# Patient Record
Sex: Female | Born: 1990 | Race: Black or African American | Hispanic: No | Marital: Single | Smoking: Never smoker
Health system: Southern US, Community
[De-identification: ages and names within clinical notes are randomized; demographics above are authoritative.]

## PROBLEM LIST (undated history)

## (undated) ENCOUNTER — Emergency Department (HOSPITAL_COMMUNITY): Admission: EM | Payer: MEDICAID | Source: Home / Self Care

## (undated) DIAGNOSIS — K219 Gastro-esophageal reflux disease without esophagitis: Secondary | ICD-10-CM

## (undated) DIAGNOSIS — F419 Anxiety disorder, unspecified: Secondary | ICD-10-CM

## (undated) DIAGNOSIS — E669 Obesity, unspecified: Secondary | ICD-10-CM

## (undated) DIAGNOSIS — I10 Essential (primary) hypertension: Secondary | ICD-10-CM

## (undated) DIAGNOSIS — G43909 Migraine, unspecified, not intractable, without status migrainosus: Secondary | ICD-10-CM

## (undated) HISTORY — PX: TONSILLECTOMY: SUR1361

---

## 2010-12-21 ENCOUNTER — Emergency Department (HOSPITAL_COMMUNITY)
Admission: EM | Admit: 2010-12-21 | Discharge: 2010-12-22 | Disposition: A | Discharge status: Home / Self Care | Payer: PRIVATE HEALTH INSURANCE | Attending: Emergency Medicine | Admitting: Emergency Medicine

## 2010-12-21 DIAGNOSIS — R599 Enlarged lymph nodes, unspecified: Secondary | ICD-10-CM | POA: Insufficient documentation

## 2010-12-21 DIAGNOSIS — R350 Frequency of micturition: Secondary | ICD-10-CM | POA: Insufficient documentation

## 2010-12-21 DIAGNOSIS — M545 Low back pain, unspecified: Secondary | ICD-10-CM | POA: Insufficient documentation

## 2010-12-21 DIAGNOSIS — R300 Dysuria: Secondary | ICD-10-CM | POA: Insufficient documentation

## 2010-12-21 DIAGNOSIS — R059 Cough, unspecified: Secondary | ICD-10-CM | POA: Insufficient documentation

## 2010-12-21 DIAGNOSIS — H9209 Otalgia, unspecified ear: Secondary | ICD-10-CM | POA: Insufficient documentation

## 2010-12-21 DIAGNOSIS — IMO0001 Reserved for inherently not codable concepts without codable children: Secondary | ICD-10-CM | POA: Insufficient documentation

## 2010-12-21 DIAGNOSIS — R05 Cough: Secondary | ICD-10-CM | POA: Insufficient documentation

## 2010-12-21 DIAGNOSIS — F411 Generalized anxiety disorder: Secondary | ICD-10-CM | POA: Insufficient documentation

## 2010-12-21 DIAGNOSIS — J3489 Other specified disorders of nose and nasal sinuses: Secondary | ICD-10-CM | POA: Insufficient documentation

## 2010-12-21 DIAGNOSIS — J020 Streptococcal pharyngitis: Secondary | ICD-10-CM | POA: Insufficient documentation

## 2010-12-22 LAB — URINALYSIS, ROUTINE W REFLEX MICROSCOPIC
Ketones, ur: NEGATIVE mg/dL
Leukocytes, UA: NEGATIVE
Nitrite: NEGATIVE
Protein, ur: NEGATIVE mg/dL
Urobilinogen, UA: 1 mg/dL (ref 0.0–1.0)

## 2010-12-22 LAB — RAPID STREP SCREEN (MED CTR MEBANE ONLY): Streptococcus, Group A Screen (Direct): POSITIVE — AB

## 2010-12-22 LAB — POCT PREGNANCY, URINE: Preg Test, Ur: NEGATIVE

## 2010-12-27 ENCOUNTER — Emergency Department (HOSPITAL_COMMUNITY)
Admission: EM | Admit: 2010-12-27 | Discharge: 2010-12-28 | Disposition: A | Discharge status: Home / Self Care | Payer: PRIVATE HEALTH INSURANCE | Attending: Emergency Medicine | Admitting: Emergency Medicine

## 2010-12-27 DIAGNOSIS — IMO0001 Reserved for inherently not codable concepts without codable children: Secondary | ICD-10-CM | POA: Insufficient documentation

## 2010-12-27 DIAGNOSIS — R5383 Other fatigue: Secondary | ICD-10-CM | POA: Insufficient documentation

## 2010-12-27 DIAGNOSIS — J029 Acute pharyngitis, unspecified: Secondary | ICD-10-CM | POA: Insufficient documentation

## 2010-12-27 DIAGNOSIS — R5381 Other malaise: Secondary | ICD-10-CM | POA: Insufficient documentation

## 2011-05-03 ENCOUNTER — Emergency Department (HOSPITAL_COMMUNITY)
Admission: EM | Admit: 2011-05-03 | Discharge: 2011-05-03 | Disposition: A | Discharge status: Home / Self Care | Payer: PRIVATE HEALTH INSURANCE | Attending: Emergency Medicine | Admitting: Emergency Medicine

## 2011-05-03 ENCOUNTER — Encounter (HOSPITAL_COMMUNITY): Payer: Self-pay | Admitting: *Deleted

## 2011-05-03 DIAGNOSIS — K219 Gastro-esophageal reflux disease without esophagitis: Secondary | ICD-10-CM

## 2011-05-03 DIAGNOSIS — R1013 Epigastric pain: Secondary | ICD-10-CM | POA: Insufficient documentation

## 2011-05-03 DIAGNOSIS — E669 Obesity, unspecified: Secondary | ICD-10-CM | POA: Insufficient documentation

## 2011-05-03 HISTORY — DX: Migraine, unspecified, not intractable, without status migrainosus: G43.909

## 2011-05-03 LAB — DIFFERENTIAL
Basophils Absolute: 0.1 10*3/uL (ref 0.0–0.1)
Eosinophils Relative: 1 % (ref 0–5)
Lymphs Abs: 6 10*3/uL — ABNORMAL HIGH (ref 0.7–4.0)
Monocytes Absolute: 0.7 10*3/uL (ref 0.1–1.0)
Neutro Abs: 4.6 10*3/uL (ref 1.7–7.7)

## 2011-05-03 LAB — COMPREHENSIVE METABOLIC PANEL
Albumin: 3.3 g/dL — ABNORMAL LOW (ref 3.5–5.2)
Alkaline Phosphatase: 82 U/L (ref 39–117)
BUN: 10 mg/dL (ref 6–23)
Chloride: 102 mEq/L (ref 96–112)
Creatinine, Ser: 0.66 mg/dL (ref 0.50–1.10)
GFR calc Af Amer: >90 mL/min (ref >90)
Glucose, Bld: 115 mg/dL — ABNORMAL HIGH (ref 70–99)
Potassium: 3.6 mEq/L (ref 3.5–5.1)
Total Bilirubin: 0.3 mg/dL (ref 0.3–1.2)

## 2011-05-03 LAB — CBC
HCT: 31 % — ABNORMAL LOW (ref 36.0–46.0)
MCH: 22.6 pg — ABNORMAL LOW (ref 26.0–34.0)
MCV: 66.2 fL — ABNORMAL LOW (ref 78.0–100.0)
Platelets: 511 10*3/uL — ABNORMAL HIGH (ref 150–400)
RDW: 16.9 % — ABNORMAL HIGH (ref 11.5–15.5)

## 2011-05-03 LAB — PATHOLOGIST SMEAR REVIEW: Tech Review: REACTIVE

## 2011-05-03 LAB — LIPASE, BLOOD: Lipase: 20 U/L (ref 11–59)

## 2011-05-03 MED ORDER — FAMOTIDINE 20 MG PO TABS: 20.0000 mg | ORAL_TABLET | Freq: Two times a day (BID) | ORAL | Status: DC

## 2011-05-03 MED ORDER — GI COCKTAIL ~~LOC~~
30.0000 mL | Freq: Once | ORAL | Status: AC
  Administered 2011-05-03: 30 mL via ORAL
  Filled 2011-05-03: qty 30

## 2011-05-03 NOTE — ED Provider Notes (Signed)
History     CSN: [id number]  Arrival date & time 05/03/11  0025   First MD Initiated Contact with Patient 05/03/11 [phone]      Chief Complaint  Patient presents with  . Abdominal Pain    epigastric    (Consider location/radiation/quality/duration/timing/severity/associated sxs/prior treatment) HPI Comments: Morbidly obese 21 year old female, who reports increased belching over the last 3-4 days with occasional burning that radiates up into her mid chest worse when she lays down and when she sits up.  Does not wake her from sleep, cannot relate this discomfort to food consumption  Patient is a 21 y.o. female presenting with abdominal pain. The history is provided by the patient.  Abdominal Pain The primary symptoms of the illness do not include abdominal pain, fever, nausea, vomiting or diarrhea. The current episode started more than 2 days ago. The onset of the illness was sudden. The problem has not changed since onset. Symptoms associated with the illness do not include chills, heartburn or constipation.    Past Medical History  Diagnosis Date  . Migraines     Past Surgical History  Procedure Date  . Tonsillectomy     Family History  Problem Relation Age of Onset  . Hypertension Mother   . Asthma Mother     History  Substance Use Topics  . Smoking status: Never Smoker   . Smokeless tobacco: Not on file  . Alcohol Use: No    OB History    Grav Para Term Preterm Abortions TAB SAB Ect Mult Living                  Review of Systems  Constitutional: Negative for fever and chills.  HENT: Negative for congestion.   Cardiovascular: Negative for chest pain.  Gastrointestinal: Negative for heartburn, nausea, vomiting, abdominal pain, diarrhea, constipation and abdominal distention.  Neurological: Negative for dizziness and weakness.    Allergies  Review of patient's allergies indicates no known allergies.  Home Medications   Current Outpatient Rx  Name Route  Sig Dispense Refill  . CALCIUM CARBONATE ANTACID 500 MG PO CHEW Oral Chew 1-2 tablets by mouth 3 (three) times daily as needed. Bloating, stomach pressure and pain    . FAMOTIDINE 20 MG PO TABS Oral Take 1 tablet (20 mg total) by mouth 2 (two) times daily. 30 tablet 0    BP 137/88  Pulse 85  Temp(Src) 97.9 F (36.6 C) (Oral)  Resp 16  SpO2 98%  LMP 04/13/2011  Physical Exam  Constitutional: She is oriented to person, place, and time. She appears well-developed and well-nourished.  Eyes: Pupils are equal, round, and reactive to light.  Cardiovascular: Normal rate.   Pulmonary/Chest: Effort normal.  Abdominal: Soft. Bowel sounds are normal. She exhibits no distension. There is tenderness.    Musculoskeletal: Normal range of motion.  Neurological: She is alert and oriented to person, place, and time.    ED Course  Procedures (including critical care time)  Labs Reviewed  CBC - Abnormal; Notable for the following:    WBC 11.5 (*)    Hemoglobin 10.6 (*)    HCT 31.0 (*)    MCV 66.2 (*)    MCH 22.6 (*)    RDW 16.9 (*)    Platelets 511 (*)    All other components within normal limits  DIFFERENTIAL - Abnormal; Notable for the following:    Neutrophils Relative 40 (*)    Lymphocytes Relative 52 (*)    Lymphs Abs 6.0 (*)  All other components within normal limits  COMPREHENSIVE METABOLIC PANEL - Abnormal; Notable for the following:    Glucose, Bld 115 (*)    Albumin 3.3 (*)    All other components within normal limits  LIPASE, BLOOD   No results found.   1. Gastro - esophageal reflux       MDM  This is most likely gastric reflux.  Due to patient's severe obesity.  Will check onset of labs including CBC CMet and lipase, and administer GI cocktail  Patient obtained significant relief after her GI cocktail was sent home with a short course of PPI and followup with GI if needed      Arman Filter, NP 05/03/11 0210  Arman Filter, NP 05/03/11 [phone]  Medical  screening examination/treatment/procedure(s) were performed by non-physician practitioner and as supervising physician I was immediately available for consultation/collaboration.  [name], MD 05/04/11 [phone]

## 2011-05-03 NOTE — ED Notes (Signed)
C/o epigastric abd pain, reports increased belching, chest pressure, a little sob, (denies: cough congestion, cold sx, fever, nvd, dizziness, or sore throat). Has tried tums (saturday/sunday) with no lasting relief.

## 2011-05-03 NOTE — Discharge Instructions (Signed)
Diet for GERD or PUD Nutrition therapy can help ease the discomfort of gastroesophageal reflux disease (GERD) and peptic ulcer disease (PUD).  HOME CARE INSTRUCTIONS   Eat your meals slowly, in a relaxed setting.   Eat 5 to 6 small meals per day.   If a food causes distress, stop eating it for a period of time.  FOODS TO AVOID  Coffee, regular or decaffeinated.   Cola beverages, regular or low calorie.   Tea, regular or decaffeinated.   Pepper.   Cocoa.   High fat foods, including meats.   Butter, margarine, hydrogenated oil (trans fats).   Peppermint or spearmint (if you have GERD).   Fruits and vegetables if not tolerated.   Alcohol.   Nicotine (smoking or chewing). This is one of the most potent stimulants to acid production in the gastrointestinal tract.   Any food that seems to aggravate your condition.  If you have questions regarding your diet, ask your caregiver or a registered dietitian. TIPS  Lying flat may make symptoms worse. Keep the head of your bed raised 6 to 9 inches (15 to 23 cm) by using a foam wedge or blocks under the legs of the bed.   Do not lay down until 3 hours after eating a meal.   Daily physical activity may help reduce symptoms.  MAKE SURE YOU:   Understand these instructions.   Will watch your condition.   Will get help right away if you are not doing well or get worse.  Document Released: 02/20/2005 Document Revised: 11/02/2010 Document Reviewed: 07/06/2008 [facility] Patient Information 2012 [place], [place]. He obtained significant relief with a GI cocktail, making to leave this is gastric reflux.  Please try not to eat 3 hours prior to laying down at night.  I given him a diet that will help with easily digested foods, if you're still having persistent belching pain after he finished the course of Pepcid.  He can buy over-the-counter Pepcid and continue taking it on a daily basis, once a day, and follow up with GI for further  diagnostic evaluation

## 2012-03-17 ENCOUNTER — Emergency Department (HOSPITAL_COMMUNITY)
Admission: EM | Admit: 2012-03-17 | Discharge: 2012-03-17 | Disposition: A | Discharge status: Home / Self Care | Payer: BC Managed Care – PPO | Attending: Emergency Medicine | Admitting: Emergency Medicine

## 2012-03-17 ENCOUNTER — Encounter (HOSPITAL_COMMUNITY): Payer: Self-pay | Admitting: *Deleted

## 2012-03-17 DIAGNOSIS — M25519 Pain in unspecified shoulder: Secondary | ICD-10-CM | POA: Insufficient documentation

## 2012-03-17 DIAGNOSIS — M25569 Pain in unspecified knee: Secondary | ICD-10-CM | POA: Insufficient documentation

## 2012-03-17 DIAGNOSIS — R52 Pain, unspecified: Secondary | ICD-10-CM

## 2012-03-17 DIAGNOSIS — Z79899 Other long term (current) drug therapy: Secondary | ICD-10-CM | POA: Insufficient documentation

## 2012-03-17 DIAGNOSIS — M549 Dorsalgia, unspecified: Secondary | ICD-10-CM | POA: Insufficient documentation

## 2012-03-17 DIAGNOSIS — Z791 Long term (current) use of non-steroidal anti-inflammatories (NSAID): Secondary | ICD-10-CM | POA: Insufficient documentation

## 2012-03-17 DIAGNOSIS — E669 Obesity, unspecified: Secondary | ICD-10-CM

## 2012-03-17 DIAGNOSIS — Z8679 Personal history of other diseases of the circulatory system: Secondary | ICD-10-CM | POA: Insufficient documentation

## 2012-03-17 HISTORY — DX: Obesity, unspecified: E66.9

## 2012-03-17 NOTE — ED Notes (Signed)
Pt reports having generalized pain that moves to diff locations. Has pain to legs, arms, head and swelling to right underarm. No distress noted at triage.

## 2012-03-17 NOTE — ED Notes (Signed)
Pt requested for temp to be taken.

## 2012-03-17 NOTE — ED Notes (Signed)
Awaiting discharge paperwork.  EDP still working on discharge.

## 2012-03-17 NOTE — ED Notes (Signed)
Pt states she was seen a month ago for pulsating pains down her spine and was told that she was having palpitations.  States she requested EKG and was told that it was normal.  Pt states she now has pain on different parts of body but is generally on the right side.  Denies nausea, vomiting, and urinary problems.  Pt reports both breast are sore at times and that her period is 1 month late.  Reports negative home pregnancy test a couple weeks ago.  Pt states she is sexually active.

## 2012-03-17 NOTE — ED Provider Notes (Signed)
History    22 year old female with multiple complaints. Patient has been having intermittent pain in multiple locations including her neck, bilateral shoulders, upper back, and knees. Denies any acute trauma. The pain is achy in character and lasts anywhere from 15 minutes to a few hours. Some relief with ibuprofen. No fevers or chills. No rash. Since yesterday patient has noticed a small hard lump in her right axilla. Tender to the touch. No drainage. No redness. No fevers or chills. No diagnosed history of diabetes.  CSN: [id number]  Arrival date & time 03/17/12  1436   First MD Initiated Contact with Patient 03/17/12 1612      Chief Complaint  Patient presents with  . Pain    (Consider location/radiation/quality/duration/timing/severity/associated sxs/prior treatment) HPI  Past Medical History  Diagnosis Date  . Migraines   . Obesity     Past Surgical History  Procedure Date  . Tonsillectomy     Family History  Problem Relation Age of Onset  . Hypertension Mother   . Asthma Mother     History  Substance Use Topics  . Smoking status: Never Smoker   . Smokeless tobacco: Not on file  . Alcohol Use: No    OB History    Grav Para Term Preterm Abortions TAB SAB Ect Mult Living                  Review of Systems  All systems reviewed and negative, other than as noted in HPI.   Allergies  Review of patient's allergies indicates no known allergies.  Home Medications   Current Outpatient Rx  Name  Route  Sig  Dispense  Refill  . ACETAMINOPHEN 325 MG PO TABS   Oral   Take 650 mg by mouth every 6 (six) hours as needed. For pain         . FAMOTIDINE 20 MG PO TABS   Oral   Take 20 mg by mouth 2 (two) times daily as needed. For acid reflux         . IBUPROFEN 200 MG PO TABS   Oral   Take 400 mg by mouth every 6 (six) hours as needed. For pain           BP 145/91  Pulse 104  Temp 98.1 F (36.7 C) (Oral)  Resp 18  SpO2 99%  LMP  02/03/2012  Physical Exam  Nursing note and vitals reviewed. Constitutional: She appears well-developed and well-nourished. No distress.       Laying in bed. No acute distress. Morbidly obese.  HENT:  Head: Normocephalic and atraumatic.  Eyes: Conjunctivae normal are normal. Right eye exhibits no discharge. Left eye exhibits no discharge.  Neck: Neck supple.  Cardiovascular: Normal rate, regular rhythm and normal heart sounds.  Exam reveals no gallop and no friction rub.   No murmur heard. Pulmonary/Chest: Effort normal and breath sounds normal. No respiratory distress.  Abdominal: Soft. She exhibits no distension. There is no tenderness.  Musculoskeletal: She exhibits no edema and no tenderness.       Full range of motion of all her joints without significant increase in pain. No concerning skin changes.  Neurological: She is alert.  Skin: Skin is warm and dry.       Patient with some mild tenderness with palpation in her right axilla. No nodes felt. No evidence of abscess or other evidence of infection.   Psychiatric: She has a normal mood and affect. Her behavior is normal. Thought content  normal.    ED Course  Procedures (including critical care time)  Labs Reviewed - No data to display No results found.   1. Pain   2. Obesity       MDM  22 year old female with atraumatic migratory pain. Suspect a lot of patient's complaints are related to her morbid obesity. Discussed lifestyle modification, diet and the importance of exercise. Do not feel that imaging or further workup is indicated at this time. NSAIDs as needed for her aches and pains. Also discussed the importance of having a primary care provider. Resource list was provided.        Raeford Razor, MD 03/17/12 [phone]

## 2012-03-31 ENCOUNTER — Encounter (HOSPITAL_COMMUNITY): Payer: Self-pay | Admitting: Physical Medicine and Rehabilitation

## 2012-03-31 ENCOUNTER — Emergency Department (HOSPITAL_COMMUNITY): Payer: BC Managed Care – PPO

## 2012-03-31 ENCOUNTER — Emergency Department (HOSPITAL_COMMUNITY)
Admission: EM | Admit: 2012-03-31 | Discharge: 2012-03-31 | Disposition: A | Discharge status: Home / Self Care | Payer: BC Managed Care – PPO | Attending: Emergency Medicine | Admitting: Emergency Medicine

## 2012-03-31 DIAGNOSIS — Z3202 Encounter for pregnancy test, result negative: Secondary | ICD-10-CM | POA: Insufficient documentation

## 2012-03-31 DIAGNOSIS — F411 Generalized anxiety disorder: Secondary | ICD-10-CM | POA: Insufficient documentation

## 2012-03-31 DIAGNOSIS — R002 Palpitations: Secondary | ICD-10-CM | POA: Insufficient documentation

## 2012-03-31 DIAGNOSIS — R0789 Other chest pain: Secondary | ICD-10-CM | POA: Insufficient documentation

## 2012-03-31 DIAGNOSIS — Z79899 Other long term (current) drug therapy: Secondary | ICD-10-CM | POA: Insufficient documentation

## 2012-03-31 DIAGNOSIS — F419 Anxiety disorder, unspecified: Secondary | ICD-10-CM

## 2012-03-31 DIAGNOSIS — Z8679 Personal history of other diseases of the circulatory system: Secondary | ICD-10-CM | POA: Insufficient documentation

## 2012-03-31 LAB — BASIC METABOLIC PANEL
Calcium: 9.2 mg/dL (ref 8.4–10.5)
GFR calc Af Amer: >90 mL/min (ref >90)
GFR calc non Af Amer: >90 mL/min (ref >90)
Potassium: 3.7 mEq/L (ref 3.5–5.1)
Sodium: 136 mEq/L (ref 135–145)

## 2012-03-31 LAB — CBC
Hemoglobin: 10.4 g/dL — ABNORMAL LOW (ref 12.0–15.0)
MCH: 20.8 pg — ABNORMAL LOW (ref 26.0–34.0)
MCHC: 33.7 g/dL (ref 30.0–36.0)
Platelets: 556 10*3/uL — ABNORMAL HIGH (ref 150–400)
RDW: 17.8 % — ABNORMAL HIGH (ref 11.5–15.5)

## 2012-03-31 LAB — PREGNANCY, URINE: Preg Test, Ur: NEGATIVE

## 2012-03-31 LAB — D-DIMER, QUANTITATIVE: D-Dimer, Quant: <0.27 ug/mL-FEU (ref 0.00–0.48)

## 2012-03-31 MED ORDER — LORAZEPAM 2 MG/ML IJ SOLN
1.0000 mg | Freq: Once | INTRAMUSCULAR | Status: AC
  Administered 2012-03-31: 1 mg via INTRAVENOUS
  Filled 2012-03-31: qty 1

## 2012-03-31 NOTE — ED Provider Notes (Signed)
History     CSN: [id number]  Arrival date & time 03/31/12  4098   First MD Initiated Contact with Patient 03/31/12 0805      Chief Complaint  Patient presents with  . Palpitations    (Consider location/radiation/quality/duration/timing/severity/associated sxs/prior treatment) Patient is a 22 y.o. female presenting with palpitations. The history is provided by the patient. No language interpreter was used.  Palpitations  This is a new problem. The current episode started 1 to 2 hours ago. Episode frequency: intermittently. The problem has been gradually improving. The problem is associated with anxiety. On average, each episode lasts 5 minutes. Associated symptoms include chest pressure (very mild). Pertinent negatives include no diaphoresis, no fever, no malaise/fatigue, no numbness, no chest pain, no claudication, no exertional chest pressure, no irregular heartbeat, no near-syncope, no orthopnea, no syncope, no abdominal pain, no nausea, no vomiting, no headaches, no back pain, no leg pain, no lower extremity edema, no dizziness, no weakness, no cough and no shortness of breath. She has tried nothing for the symptoms. Risk factors include obesity and a sedentary lifestyle.    Past Medical History  Diagnosis Date  . Migraines   . Obesity     Past Surgical History  Procedure Date  . Tonsillectomy     Family History  Problem Relation Age of Onset  . Hypertension Mother   . Asthma Mother     History  Substance Use Topics  . Smoking status: Never Smoker   . Smokeless tobacco: Not on file  . Alcohol Use: No    OB History    Grav Para Term Preterm Abortions TAB SAB Ect Mult Living                  Review of Systems  Constitutional: Negative for fever, chills, malaise/fatigue, diaphoresis and fatigue.  HENT: Negative for congestion, sore throat, rhinorrhea and neck pain.   Eyes: Negative for visual disturbance.  Respiratory: Positive for chest tightness. Negative for  cough, choking and shortness of breath.   Cardiovascular: Positive for palpitations. Negative for chest pain, orthopnea, claudication, leg swelling, syncope and near-syncope.  Gastrointestinal: Negative for nausea, vomiting, abdominal pain and diarrhea.  Genitourinary: Negative for dysuria.  Musculoskeletal: Negative for myalgias, back pain and arthralgias.  Skin: Negative for rash and wound.  Neurological: Negative for dizziness, syncope, weakness, light-headedness, numbness and headaches.  Psychiatric/Behavioral: Negative for hallucinations and dysphoric mood. The patient is nervous/anxious.     Allergies  Review of patient's allergies indicates no known allergies.  Home Medications   Current Outpatient Rx  Name  Route  Sig  Dispense  Refill  . ACETAMINOPHEN 325 MG PO TABS   Oral   Take 650 mg by mouth every 6 (six) hours as needed. For pain         . FAMOTIDINE 20 MG PO TABS   Oral   Take 20 mg by mouth 2 (two) times daily as needed. For acid reflux         . IBUPROFEN 200 MG PO TABS   Oral   Take 400 mg by mouth every 6 (six) hours as needed. For pain         . NORGESTIMATE-ETH ESTRADIOL 0.25-35 MG-MCG PO TABS   Oral   Take 1 tablet by mouth daily.         . SERTRALINE HCL 50 MG PO TABS   Oral   Take 50 mg by mouth daily.  BP 149/83  Pulse 87  Temp 97.8 F (36.6 C) (Oral)  Resp 18  SpO2 100%  LMP 02/03/2012  Physical Exam  Nursing note and vitals reviewed. Constitutional: She is oriented to person, place, and time. She appears well-nourished. No distress.       Pt is morbidly obese   HENT:  Head: Normocephalic and atraumatic.  Right Ear: External ear normal.  Left Ear: External ear normal.  Nose: Nose normal.  Mouth/Throat: Oropharynx is clear and moist. No oropharyngeal exudate.  Eyes: Conjunctivae normal are normal. Pupils are equal, round, and reactive to light. Right eye exhibits no discharge. Left eye exhibits no discharge. No  scleral icterus.  Neck: Normal range of motion. Neck supple. No JVD present. No tracheal deviation present.  Cardiovascular: Normal rate, regular rhythm, normal heart sounds and intact distal pulses.  Exam reveals no gallop and no friction rub.   No murmur heard. Pulmonary/Chest: Effort normal and breath sounds normal. No stridor. No respiratory distress. She has no wheezes. She has no rales. She exhibits no tenderness.  Abdominal: Soft. Bowel sounds are normal. She exhibits no distension and no mass. There is no tenderness. There is no rebound and no guarding.       Obese, protuberant.   Genitourinary: No breast swelling, tenderness, discharge or bleeding.       Large, pendulous breast.  Note no skin changes.  Right breast has no specific tenderness.  There are no skin changes or erythema along the skin folds.  Musculoskeletal: Normal range of motion. She exhibits no edema and no tenderness.       No calf tenderness   Lymphadenopathy:    She has no cervical adenopathy.  Neurological: She is alert and oriented to person, place, and time.  Skin: Skin is warm and dry. No rash noted. She is not diaphoretic. No erythema. No pallor.       Note no swelling under right arm.  Skin appears normal.  No erythema.  No nodules or palpable fluctuance.  Psychiatric: She has a normal mood and affect. Her behavior is normal.    ED Course  Procedures (including critical care time)   Labs Reviewed  CBC  BASIC METABOLIC PANEL  D-DIMER, QUANTITATIVE  PREGNANCY, URINE   No results found.   No diagnosis found.   Date: 03/31/2012 @ 0800  Rate: 93 bpm  Rhythm: sinus  QRS Axis: normal  Intervals: normal  ST/T Wave abnormalities: normal  Conduction Disutrbances:none  Narrative Interpretation:   Old EKG Reviewed: none available      MDM  Pt presents for evaluation of palpitations.  She also reports swelling in her right axilla and vague right breast discomfort.  She appears nontoxic, note  stable VS, NAD.  Note no skin changes, masses, or fluctuance on inspection of the right axilla.  She has no swelling of the breast or specific breast tenderness.  The EKG performed during the triage process demonstrates no dysrhythmia or evidence of acute ischemia.  She has no calf tenderness.  She is morbidly obese, admits to a sedentary lifestyle, and is taking an oral contraceptive medication.  Will obtain basic labs,  CXR, and d-dimer.  She has a hx of anxiety but states she has not experienced a panic attack in over 1 year.  1020.  Pt stable, NAD.  She appears comfortable.  Although she has some anemia, this is not an acute issue.  Labs are otherwise unremarkable.  The d-dimer is negative.  She has had  no tachycardia or witnessed dysrhythmia since arriving in the ER.  Plan discharge home.  Encouraged outpt follow-up with a primary physician and gyn provider.  If palpitations persists, she has been given information to follow-up with a local cardiologist for placement of a holter/event monitor.        Tobin Chad, MD 03/31/12 1023

## 2012-03-31 NOTE — ED Notes (Signed)
Pt presents to department for evaluation of heart palpitations. States onset this morning while at home resting. States she could feel her heart "racing." denies chest pain at present. Respirations unlabored. Skin warm and dry. She is alert and oriented x4. Also c/o pain to R breast and axillary region.

## 2012-05-21 ENCOUNTER — Emergency Department (HOSPITAL_COMMUNITY)
Admission: EM | Admit: 2012-05-21 | Discharge: 2012-05-21 | Disposition: A | Discharge status: Home / Self Care | Payer: BC Managed Care – PPO | Attending: Emergency Medicine | Admitting: Emergency Medicine

## 2012-05-21 ENCOUNTER — Emergency Department (HOSPITAL_COMMUNITY): Payer: BC Managed Care – PPO

## 2012-05-21 ENCOUNTER — Encounter (HOSPITAL_COMMUNITY): Payer: Self-pay | Admitting: *Deleted

## 2012-05-21 DIAGNOSIS — R0602 Shortness of breath: Secondary | ICD-10-CM | POA: Insufficient documentation

## 2012-05-21 DIAGNOSIS — Z8679 Personal history of other diseases of the circulatory system: Secondary | ICD-10-CM | POA: Insufficient documentation

## 2012-05-21 DIAGNOSIS — F411 Generalized anxiety disorder: Secondary | ICD-10-CM | POA: Insufficient documentation

## 2012-05-21 DIAGNOSIS — E669 Obesity, unspecified: Secondary | ICD-10-CM | POA: Insufficient documentation

## 2012-05-21 DIAGNOSIS — K219 Gastro-esophageal reflux disease without esophagitis: Secondary | ICD-10-CM

## 2012-05-21 DIAGNOSIS — M549 Dorsalgia, unspecified: Secondary | ICD-10-CM | POA: Insufficient documentation

## 2012-05-21 DIAGNOSIS — Z79899 Other long term (current) drug therapy: Secondary | ICD-10-CM | POA: Insufficient documentation

## 2012-05-21 DIAGNOSIS — I10 Essential (primary) hypertension: Secondary | ICD-10-CM | POA: Insufficient documentation

## 2012-05-21 DIAGNOSIS — R209 Unspecified disturbances of skin sensation: Secondary | ICD-10-CM | POA: Insufficient documentation

## 2012-05-21 DIAGNOSIS — R0789 Other chest pain: Secondary | ICD-10-CM | POA: Insufficient documentation

## 2012-05-21 HISTORY — DX: Gastro-esophageal reflux disease without esophagitis: K21.9

## 2012-05-21 HISTORY — DX: Anxiety disorder, unspecified: F41.9

## 2012-05-21 HISTORY — DX: Essential (primary) hypertension: I10

## 2012-05-21 LAB — URINALYSIS, ROUTINE W REFLEX MICROSCOPIC
Glucose, UA: NEGATIVE mg/dL
Hgb urine dipstick: NEGATIVE
Leukocytes, UA: NEGATIVE
Protein, ur: NEGATIVE mg/dL
pH: 7 (ref 5.0–8.0)

## 2012-05-21 MED ORDER — SUCRALFATE 1 G PO TABS
1.0000 g | ORAL_TABLET | Freq: Once | ORAL | Status: AC
  Administered 2012-05-21: 1 g via ORAL
  Filled 2012-05-21: qty 1

## 2012-05-21 MED ORDER — SUCRALFATE 1 G PO TABS: 1.0000 g | ORAL_TABLET | Freq: Four times a day (QID) | ORAL | Status: DC

## 2012-05-21 MED ORDER — LORAZEPAM 1 MG PO TABS
1.0000 mg | ORAL_TABLET | Freq: Once | ORAL | Status: AC
  Administered 2012-05-21: 1 mg via ORAL
  Filled 2012-05-21: qty 1

## 2012-05-21 MED ORDER — LANSOPRAZOLE 30 MG PO CPDR: 30.0000 mg | DELAYED_RELEASE_CAPSULE | Freq: Every day | ORAL | Status: AC

## 2012-05-21 MED ORDER — GI COCKTAIL ~~LOC~~
30.0000 mL | Freq: Once | ORAL | Status: AC
  Administered 2012-05-21: 30 mL via ORAL
  Filled 2012-05-21: qty 30

## 2012-05-21 NOTE — ED Notes (Signed)
Pt states that on Sunday she was having pressure in her chest. Pt states that she thought it was acid reflux. Pt states that she pepcid and that did not help with the pain. Pt states that her pain came back today radiated down her right arm and in her upper back causing SOB

## 2012-05-21 NOTE — ED Provider Notes (Signed)
History     CSN: [id number]  Arrival date & time 05/21/12  0246   First MD Initiated Contact with Patient 05/21/12 [phone]      Chief Complaint  Patient presents with  . Shortness of Breath  . Chest Pain    (Consider location/radiation/quality/duration/timing/severity/associated sxs/prior treatment) HPI 22 year old female presents to the emergency department with complaint of chest pressure with radiation into her right breast, numbness of her right arm.  And pain in her upper back, radiating down into her lower back.  She first noted some symptoms on Sunday.  They have been persistent throughout the entire day today.  Patient thought symptoms may be due to GERD, as they are similar to, what she's had before.  She took a Pepcid without improvement in her symptoms.  Patient is nonsmoker, no family history of coronary disease.  Patient has history of reflux, hypertension, and anxiety.  Patient had been on birth control pills, but has been off for at least a month.  She denies any leg swelling.  No shortness of breath.  Past Medical History  Diagnosis Date  . Migraines   . Obesity   . Hypertension   . Anxiety   . Acid reflux     Past Surgical History  Procedure Laterality Date  . Tonsillectomy      Family History  Problem Relation Age of Onset  . Hypertension Mother   . Asthma Mother     History  Substance Use Topics  . Smoking status: Never Smoker   . Smokeless tobacco: Not on file  . Alcohol Use: No    OB History   Grav Para Term Preterm Abortions TAB SAB Ect Mult Living                  Review of Systems  All other systems reviewed and are negative.     Allergies her  Review of patient's allergies indicates no known allergies.  Home Medications   Current Outpatient Rx  Name  Route  Sig  Dispense  Refill  . famotidine (PEPCID) 20 MG tablet   Oral   Take 20 mg by mouth 2 (two) times daily as needed. For acid reflux         . FLUoxetine (PROZAC) 10 MG  capsule   Oral   Take 10 mg by mouth daily.         . hydrochlorothiazide (HYDRODIURIL) 25 MG tablet   Oral   Take 25 mg by mouth daily.         Marland Kitchen lisinopril (PRINIVIL,ZESTRIL) 10 MG tablet   Oral   Take 10 mg by mouth daily.           BP 153/86  Pulse 87  Temp(Src) 98 F (36.7 C) (Oral)  Resp 14  SpO2 99%  LMP 05/03/2012  Physical Exam  Constitutional: She is oriented to person, place, and time. She appears distressed (uncomfortable appearing).  Morbid obesity noted  HENT:  Head: Normocephalic and atraumatic.  Nose: Nose normal.  Mouth/Throat: Oropharynx is clear and moist.  Eyes: Conjunctivae and EOM are normal. Pupils are equal, round, and reactive to light.  Neck: Normal range of motion. Neck supple. No JVD present. No tracheal deviation present. No thyromegaly present.  Cardiovascular: Normal rate, regular rhythm, normal heart sounds and intact distal pulses.  Exam reveals no gallop and no friction rub.   No murmur heard. Pulmonary/Chest: Effort normal and breath sounds normal. No stridor. No respiratory distress. She has no wheezes.  She has no rales. She exhibits no tenderness.  Abdominal: Soft. Bowel sounds are normal. She exhibits no distension and no mass. There is tenderness (mild epigastric tenderness). There is no rebound and no guarding.  Musculoskeletal: Normal range of motion. She exhibits no edema and no tenderness.  Lymphadenopathy:    She has no cervical adenopathy.  Neurological: She is alert and oriented to person, place, and time. She exhibits normal muscle tone. Coordination normal.  Skin: Skin is warm and dry. No rash noted. She is not diaphoretic. No erythema. No pallor.  Psychiatric: She has a normal mood and affect. Her behavior is normal. Judgment and thought content normal.    ED Course  Procedures (including critical care time)  Labs Reviewed  URINALYSIS, ROUTINE W REFLEX MICROSCOPIC   Dg Chest 2 View  05/21/2012  *RADIOLOGY REPORT*   Clinical Data: Mid and upper chest pain.  CHEST - 2 VIEW  Comparison: 03/31/2012  Findings: Shallow inspiration.  Borderline heart size and pulmonary vascularity.  No focal consolidation or airspace disease.  No blunting of costophrenic angles.  No pneumothorax.  Mediastinal contours appear intact.  No significant change since previous study.  IMPRESSION: Shallow inspiration.  No evidence of active pulmonary disease.   Original Report Authenticated By: Burman Nieves, M.D.     Date: 05/21/2012  Rate: 88  Rhythm: normal sinus rhythm  QRS Axis: normal  Intervals: normal  ST/T Wave abnormalities: normal  Conduction Disutrbances:none  Narrative Interpretation:   Old EKG Reviewed: unchanged    1. Atypical chest pain   2. GERD (gastroesophageal reflux disease)       MDM   22 year old female with chest discomfort.  Will check chest x-ray.  Do not feel symptoms are due to ACS, PE, pneumonia, bronchitis.  Will attempt to relieve symptoms with reflux medications, and small dose of Ativan given her history of anxiety.        Olivia Mackie, MD 05/21/12 [phone]

## 2012-05-21 NOTE — ED Notes (Signed)
Pt to xray, no changes, alert, NAD, calm. 

## 2012-05-21 NOTE — ED Notes (Signed)
Dr. Otter EDP at BS 

## 2012-06-16 ENCOUNTER — Encounter (HOSPITAL_COMMUNITY): Payer: Self-pay | Admitting: *Deleted

## 2012-06-16 ENCOUNTER — Emergency Department (HOSPITAL_COMMUNITY)
Admission: EM | Admit: 2012-06-16 | Discharge: 2012-06-16 | Disposition: A | Discharge status: Home / Self Care | Payer: BC Managed Care – PPO | Attending: Emergency Medicine | Admitting: Emergency Medicine

## 2012-06-16 DIAGNOSIS — K219 Gastro-esophageal reflux disease without esophagitis: Secondary | ICD-10-CM | POA: Insufficient documentation

## 2012-06-16 DIAGNOSIS — R350 Frequency of micturition: Secondary | ICD-10-CM | POA: Insufficient documentation

## 2012-06-16 DIAGNOSIS — M79604 Pain in right leg: Secondary | ICD-10-CM

## 2012-06-16 DIAGNOSIS — M79609 Pain in unspecified limb: Secondary | ICD-10-CM | POA: Insufficient documentation

## 2012-06-16 DIAGNOSIS — Z79899 Other long term (current) drug therapy: Secondary | ICD-10-CM | POA: Insufficient documentation

## 2012-06-16 DIAGNOSIS — R000 Tachycardia, unspecified: Secondary | ICD-10-CM | POA: Insufficient documentation

## 2012-06-16 DIAGNOSIS — E669 Obesity, unspecified: Secondary | ICD-10-CM | POA: Insufficient documentation

## 2012-06-16 DIAGNOSIS — Z3202 Encounter for pregnancy test, result negative: Secondary | ICD-10-CM | POA: Insufficient documentation

## 2012-06-16 DIAGNOSIS — F411 Generalized anxiety disorder: Secondary | ICD-10-CM | POA: Insufficient documentation

## 2012-06-16 DIAGNOSIS — G43909 Migraine, unspecified, not intractable, without status migrainosus: Secondary | ICD-10-CM | POA: Insufficient documentation

## 2012-06-16 DIAGNOSIS — I10 Essential (primary) hypertension: Secondary | ICD-10-CM | POA: Insufficient documentation

## 2012-06-16 LAB — BASIC METABOLIC PANEL
CO2: 26 mEq/L (ref 19–32)
Chloride: 100 mEq/L (ref 96–112)
Glucose, Bld: 112 mg/dL — ABNORMAL HIGH (ref 70–99)
Sodium: 136 mEq/L (ref 135–145)

## 2012-06-16 LAB — CBC
HCT: 30.9 % — ABNORMAL LOW (ref 36.0–46.0)
Hemoglobin: 10.8 g/dL — ABNORMAL LOW (ref 12.0–15.0)
MCH: 21.3 pg — ABNORMAL LOW (ref 26.0–34.0)
MCV: 60.8 fL — ABNORMAL LOW (ref 78.0–100.0)
RBC: 5.08 MIL/uL (ref 3.87–5.11)
WBC: 11.5 10*3/uL — ABNORMAL HIGH (ref 4.0–10.5)

## 2012-06-16 LAB — POCT I-STAT TROPONIN I

## 2012-06-16 MED ORDER — IBUPROFEN 600 MG PO TABS: 600.0000 mg | ORAL_TABLET | Freq: Four times a day (QID) | ORAL | Status: DC | PRN

## 2012-06-16 MED ORDER — KETOROLAC TROMETHAMINE 30 MG/ML IJ SOLN
30.0000 mg | Freq: Once | INTRAMUSCULAR | Status: AC
  Administered 2012-06-16: 30 mg via INTRAVENOUS
  Filled 2012-06-16: qty 1

## 2012-06-16 MED ORDER — SODIUM CHLORIDE 0.9 % IV BOLUS (SEPSIS)
1000.0000 mL | Freq: Once | INTRAVENOUS | Status: AC
  Administered 2012-06-16: 1000 mL via INTRAVENOUS

## 2012-06-16 MED ORDER — LORAZEPAM 2 MG/ML IJ SOLN
1.0000 mg | Freq: Once | INTRAMUSCULAR | Status: AC
  Administered 2012-06-16: 1 mg via INTRAVENOUS
  Filled 2012-06-16: qty 1

## 2012-06-16 NOTE — ED Notes (Signed)
MD at bedside. 

## 2012-06-16 NOTE — ED Notes (Signed)
Pt and family given ice water.  

## 2012-06-16 NOTE — ED Notes (Signed)
Pt states that her pain "moves." Pt reports that her pain was in her legs earlier, and is now in her back, and yesterday was in her stomach.

## 2012-06-16 NOTE — ED Notes (Signed)
Pt c/o pain all over, that "jumps around".  States she checked BP at home and it was normal, however her HR was high.

## 2012-06-16 NOTE — ED Notes (Signed)
Pt given fluids.  

## 2012-06-16 NOTE — ED Notes (Signed)
Pt is resting.

## 2012-06-16 NOTE — ED Notes (Signed)
Pt denies chest pain at this time.

## 2012-06-16 NOTE — ED Provider Notes (Signed)
History     CSN: [id number]  Arrival date & time 06/16/12  0116   First MD Initiated Contact with Patient 06/16/12 0150      Chief Complaint  Patient presents with  . Tachycardia  . Leg Pain    (Consider location/radiation/quality/duration/timing/severity/associated sxs/prior treatment) HPI History provided by patient. Watching TV at home tonight and unable to sleep with right hip pain radiating to her right knee. She had some back pain earlier but denies any at this time. She felt uncomfortable checked her blood pressure and noticed her heart rate was elevated. She has a history of anxiety and this made her more anxious. She takes medications as prescribed. She denies any recent fevers cough cold or illness. No difficulty urinating. She does have urinary frequency with blood pressure medications but denies any recent changes in urination. She is sexually active with last menstrual period February. No abdominal pain. No pelvic pain. No vaginal bleeding or discharge. Symptoms moderate in severity. No chest pain or shortness of breath. No trauma. Past Medical History  Diagnosis Date  . Migraines   . Obesity   . Hypertension   . Anxiety   . Acid reflux     Past Surgical History  Procedure Laterality Date  . Tonsillectomy      Family History  Problem Relation Age of Onset  . Hypertension Mother   . Asthma Mother     History  Substance Use Topics  . Smoking status: Never Smoker   . Smokeless tobacco: Not on file  . Alcohol Use: Yes    OB History   Grav Para Term Preterm Abortions TAB SAB Ect Mult Living                  Review of Systems  Constitutional: Negative for fever and chills.  HENT: Negative for neck pain and neck stiffness.   Eyes: Negative for visual disturbance.  Respiratory: Negative for shortness of breath.   Cardiovascular: Negative for chest pain.  Gastrointestinal: Negative for abdominal pain.  Genitourinary: Negative for dysuria.   Musculoskeletal: Negative for back pain.  Skin: Negative for rash.  Neurological: Negative for headaches.  Psychiatric/Behavioral: The patient is nervous/anxious.   All other systems reviewed and are negative.    Allergies  Review of patient's allergies indicates no known allergies.  Home Medications   Current Outpatient Rx  Name  Route  Sig  Dispense  Refill  . FLUoxetine (PROZAC) 10 MG capsule   Oral   Take 10 mg by mouth daily.         . hydrochlorothiazide (HYDRODIURIL) 25 MG tablet   Oral   Take 25 mg by mouth daily.         . lansoprazole (PREVACID) 30 MG capsule   Oral   Take 1 capsule (30 mg total) by mouth daily.   30 capsule   0   . lisinopril (PRINIVIL,ZESTRIL) 10 MG tablet   Oral   Take 10 mg by mouth daily.         . sucralfate (CARAFATE) 1 G tablet   Oral   Take 1 tablet (1 g total) by mouth 4 (four) times daily.   30 tablet   0     BP 135/68  Pulse 105  Temp(Src) 97.9 F (36.6 C) (Oral)  Resp 20  SpO2 98%  LMP 05/03/2012  Physical Exam  Constitutional: She is oriented to person, place, and time. She appears well-developed and well-nourished.  HENT:  Head: Normocephalic and atraumatic.  Mouth/Throat: Oropharynx is clear and moist. No oropharyngeal exudate.  Eyes: EOM are normal. Pupils are equal, round, and reactive to light.  Neck: Neck supple.  Cardiovascular: Regular rhythm and intact distal pulses.   Mild tachycardia  Pulmonary/Chest: Effort normal. No respiratory distress. She exhibits no tenderness.  Abdominal: Soft. Bowel sounds are normal. She exhibits no distension. There is no tenderness.  Musculoskeletal: Normal range of motion. She exhibits no edema and no tenderness.  No cervical, thoracic or lumbar tenderness. Mild tenderness over right hip without deformity or swelling. Distal neurovascular intact x4. Gait normal.  Neurological: She is alert and oriented to person, place, and time.  Skin: Skin is warm and dry. No rash  noted.    ED Course  Procedures (including critical care time)  Results for orders placed during the hospital encounter of 06/16/12  BASIC METABOLIC PANEL      Result Value Range   Sodium 136  135 - 145 mEq/L   Potassium 4.0  3.5 - 5.1 mEq/L   Chloride 100  96 - 112 mEq/L   CO2 26  19 - 32 mEq/L   Glucose, Bld 112 (*) 70 - 99 mg/dL   BUN 10  6 - 23 mg/dL   Creatinine, Ser 2.95  0.50 - 1.10 mg/dL   Calcium 9.4  8.4 - 28.4 mg/dL   GFR calc non Af Amer >90  >90 mL/min   GFR calc Af Amer >90  >90 mL/min  CBC      Result Value Range   WBC 11.5 (*) 4.0 - 10.5 K/uL   RBC 5.08  3.87 - 5.11 MIL/uL   Hemoglobin 10.8 (*) 12.0 - 15.0 g/dL   HCT 13.2 (*) 44.0 - 10.2 %   MCV 60.8 (*) 78.0 - 100.0 fL   MCH 21.3 (*) 26.0 - 34.0 pg   MCHC 35.0  30.0 - 36.0 g/dL   RDW 72.5 (*) 36.6 - 44.0 %   Platelets 581 (*) 150 - 400 K/uL  POCT PREGNANCY, URINE      Result Value Range   Preg Test, Ur NEGATIVE  NEGATIVE  POCT I-STAT TROPONIN I      Result Value Range   Troponin i, poc 0.00  0.00 - 0.08 ng/mL   Comment 3               Date: 06/16/2012  Rate: 108  Rhythm: sinus tachycardia  QRS Axis: normal  Intervals: normal  ST/T Wave abnormalities: nonspecific ST changes  Conduction Disutrbances:none  Narrative Interpretation:   Old EKG Reviewed: no sig changes  IV fluids. IV Toradol. IV Ativan.  4:39 AM recheck - leg pain improved. Feeling comfortable denies any complaints, HR 90s, still for discharge home and plan outpatient followup. Prescription for Motrin provided. Strict return precautions verbalized is understood. MDM   Patient with history of anxiety, presenting with right leg pain and tachycardia at home. Improved with IV fluids and medications as above. Labs obtained/reviewed - mild anemia, UPT negative. No back pain or right lower extremity deficits or clinical evidence of infection. Serial evaluations. Vital signs reviewed - and improved.        [name], MD 06/16/12  [phone]

## 2014-04-08 IMAGING — CR DG CHEST 2V
2 series · 2 of 2 positions shown · non-contrast
Comparison: None.

CLINICAL DATA: Left-sided chest pain.  Palpitations.

CHEST - 2 VIEW

[w chest pa]
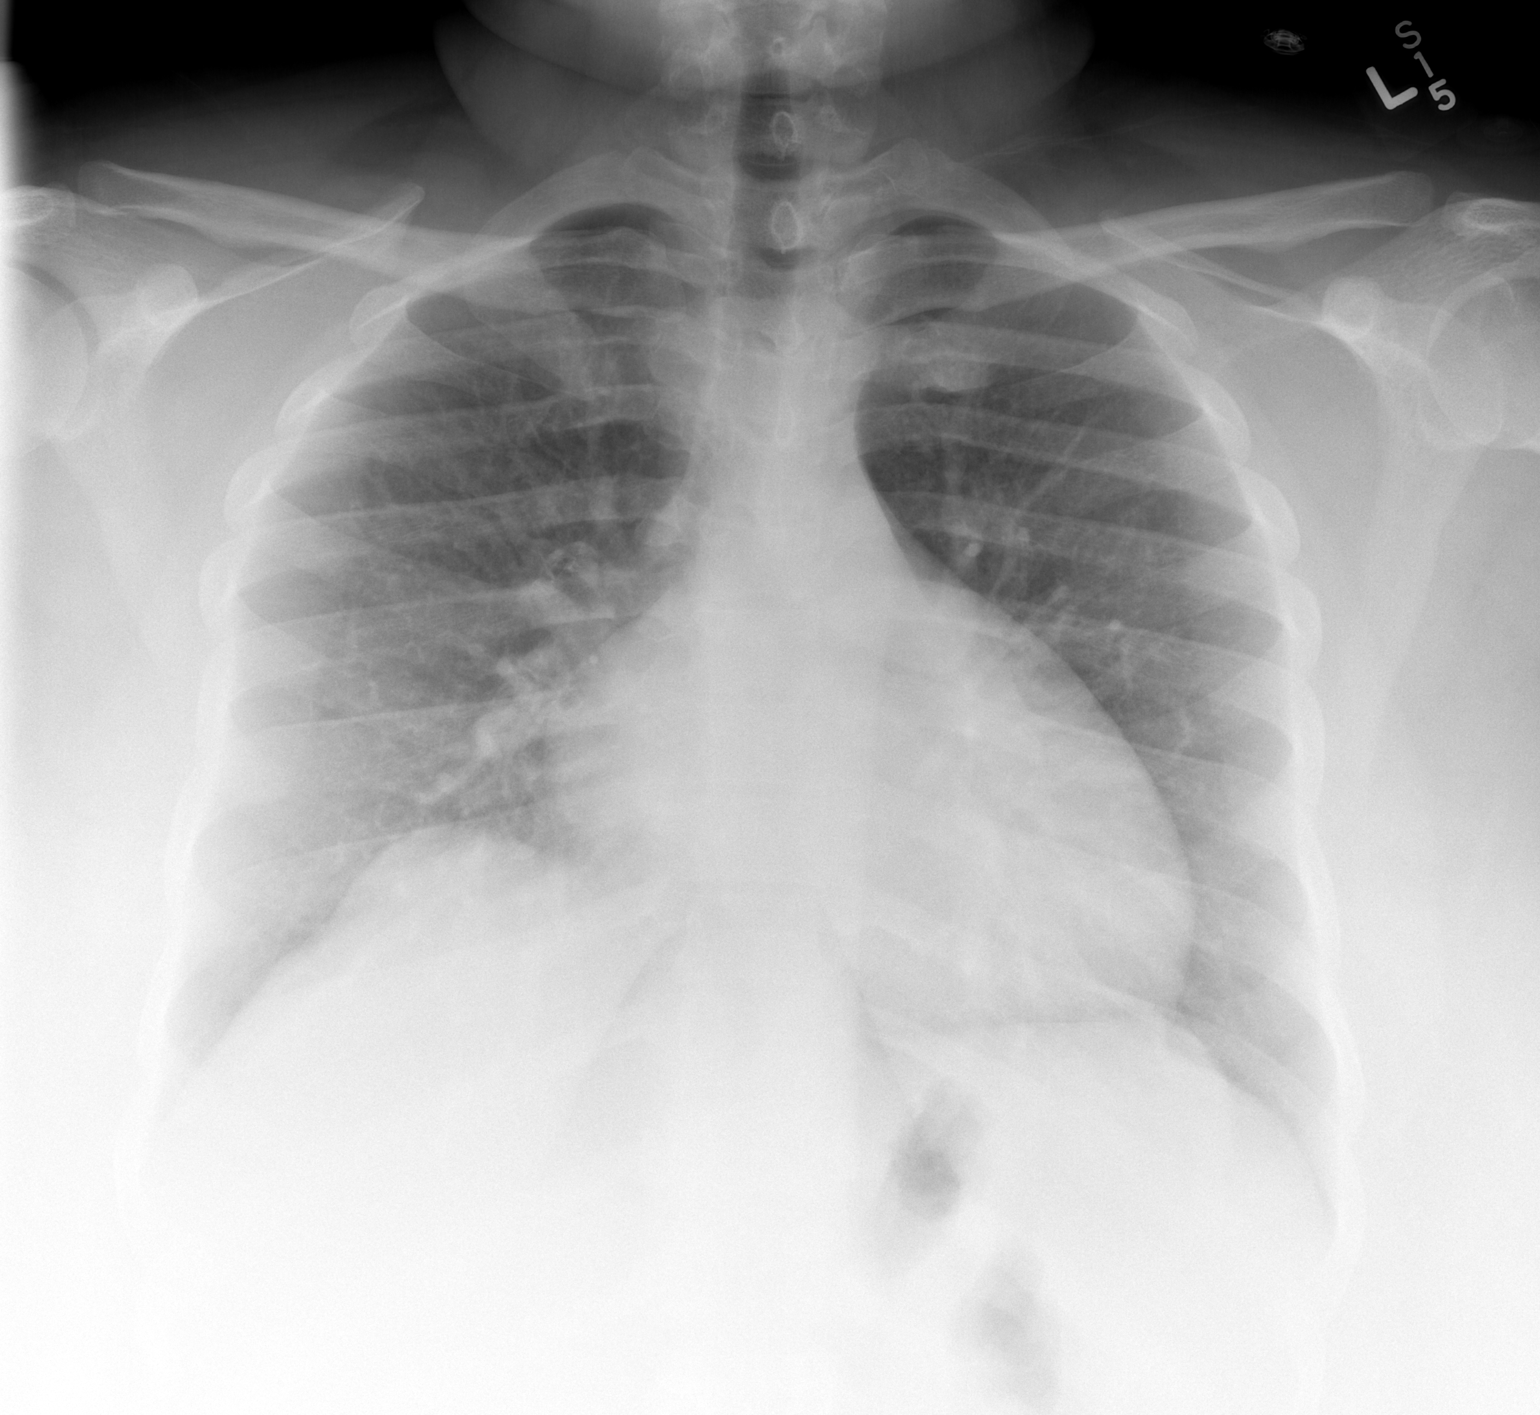

[w chest lat]
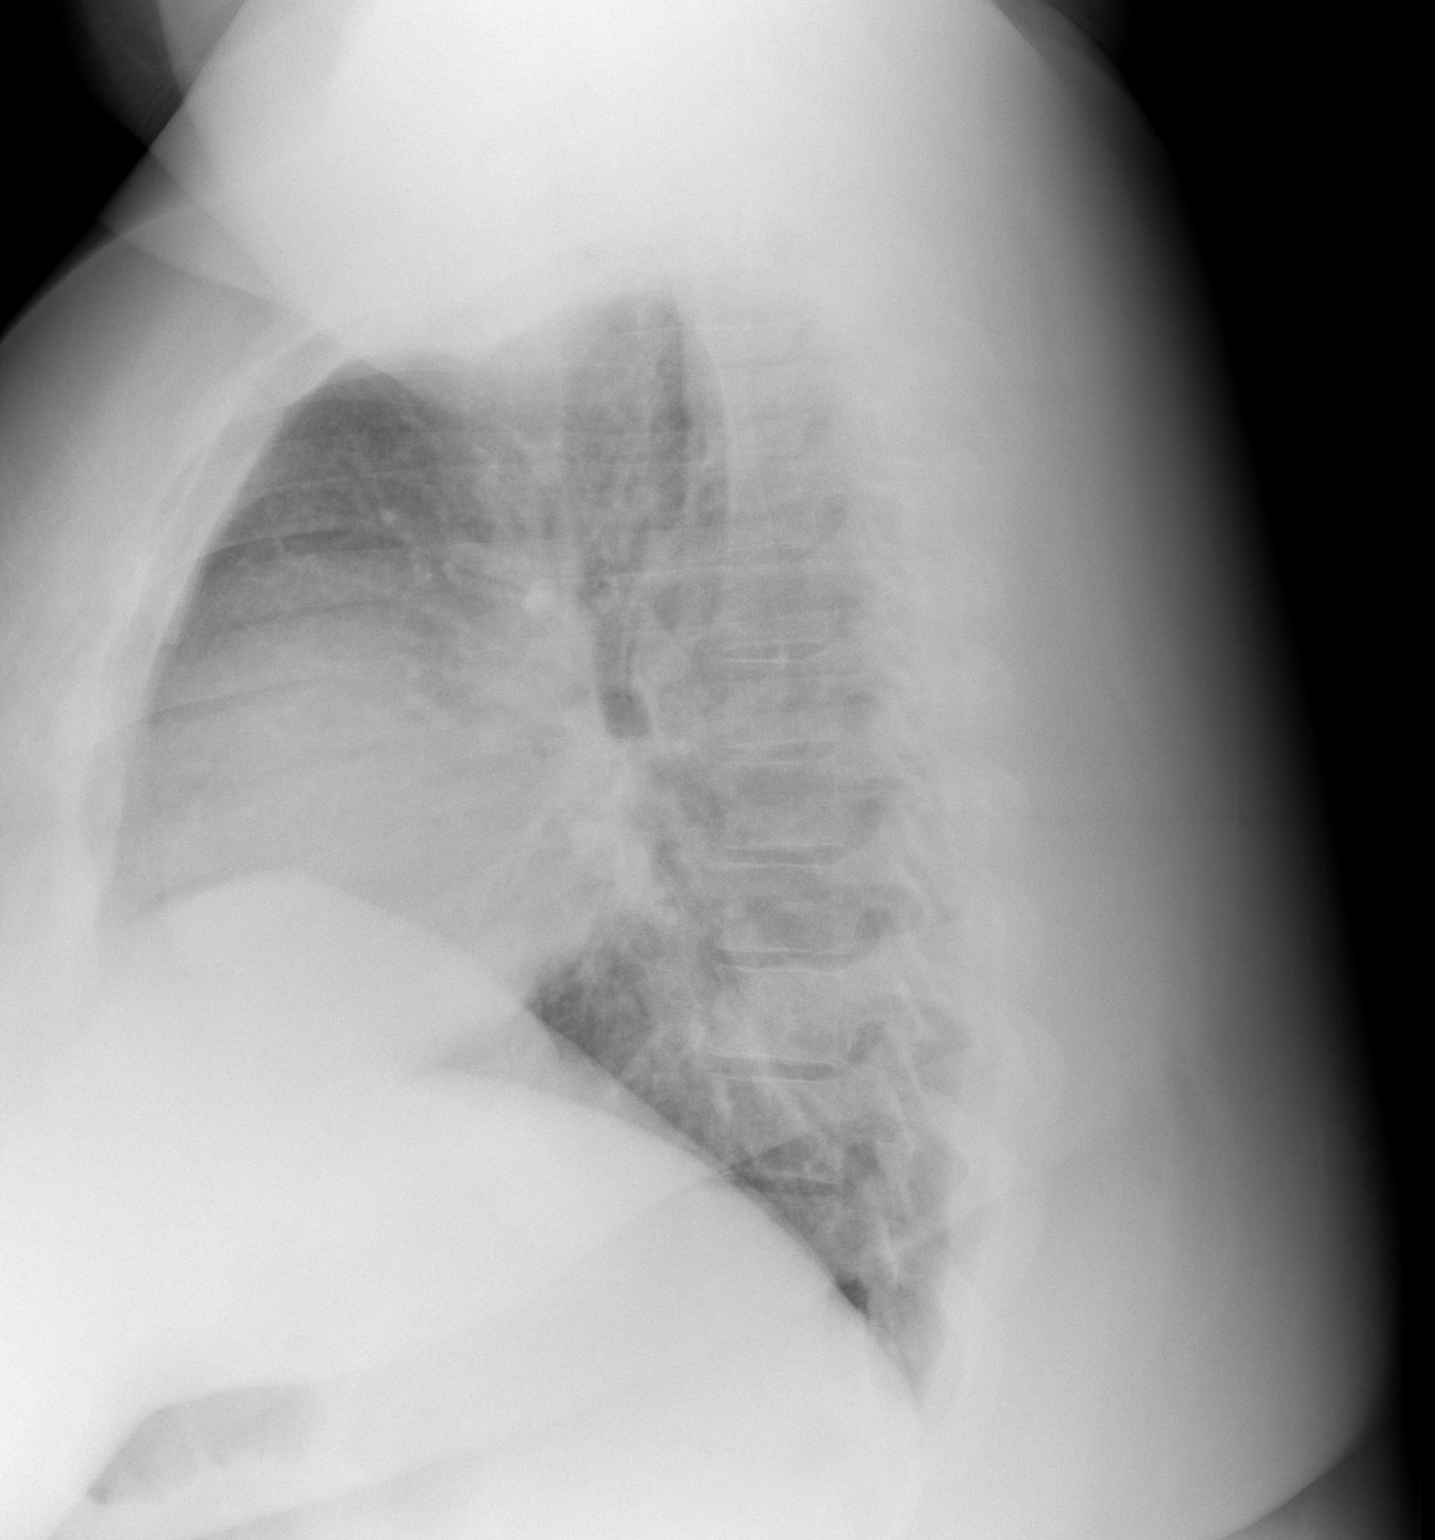

[2 of 2 positions shown; findings below may reference images not displayed]

FINDINGS: Cardiac silhouette upper normal in size to slightly
enlarged for age.  Hilar and mediastinal contours otherwise
unremarkable.  Lungs clear.  Bronchovascular markings normal.
Pulmonary vascularity normal.  No pneumothorax.  No pleural
effusions.  Visualized bony thorax intact.
IMPRESSION: Borderline heart size for age.  No acute cardiopulmonary disease.

## 2014-06-10 ENCOUNTER — Emergency Department (HOSPITAL_COMMUNITY)
Admission: EM | Admit: 2014-06-10 | Discharge: 2014-06-11 | Disposition: A | Discharge status: Home / Self Care | Payer: Self-pay | Attending: Emergency Medicine | Admitting: Emergency Medicine

## 2014-06-10 ENCOUNTER — Encounter (HOSPITAL_COMMUNITY): Payer: Self-pay | Admitting: *Deleted

## 2014-06-10 DIAGNOSIS — K219 Gastro-esophageal reflux disease without esophagitis: Secondary | ICD-10-CM | POA: Insufficient documentation

## 2014-06-10 DIAGNOSIS — Z79899 Other long term (current) drug therapy: Secondary | ICD-10-CM | POA: Insufficient documentation

## 2014-06-10 DIAGNOSIS — I10 Essential (primary) hypertension: Secondary | ICD-10-CM | POA: Insufficient documentation

## 2014-06-10 DIAGNOSIS — E669 Obesity, unspecified: Secondary | ICD-10-CM | POA: Insufficient documentation

## 2014-06-10 DIAGNOSIS — A084 Viral intestinal infection, unspecified: Secondary | ICD-10-CM | POA: Insufficient documentation

## 2014-06-10 DIAGNOSIS — F419 Anxiety disorder, unspecified: Secondary | ICD-10-CM | POA: Insufficient documentation

## 2014-06-10 NOTE — ED Notes (Signed)
The pt thinks she may have food poisoning.  She ate a burger at 1230 today and 2 hours after she began to vomit with diarrhea and abd cramps.  lmp last week

## 2014-06-11 MED ORDER — RANITIDINE HCL 150 MG/10ML PO SYRP
300.0000 mg | ORAL_SOLUTION | Freq: Once | ORAL | Status: AC
  Administered 2014-06-11: 300 mg via ORAL
  Filled 2014-06-11: qty 20

## 2014-06-11 MED ORDER — ONDANSETRON 4 MG PO TBDP: 4.0000 mg | ORAL_TABLET | Freq: Three times a day (TID) | ORAL | Status: AC | PRN

## 2014-06-11 MED ORDER — ONDANSETRON 4 MG PO TBDP
4.0000 mg | ORAL_TABLET | Freq: Once | ORAL | Status: AC
  Administered 2014-06-11: 4 mg via ORAL
  Filled 2014-06-11: qty 1

## 2014-06-11 MED ORDER — IBUPROFEN 800 MG PO TABS: 800.0000 mg | ORAL_TABLET | Freq: Three times a day (TID) | ORAL | Status: AC | PRN

## 2014-06-11 MED ORDER — DICYCLOMINE HCL 10 MG PO CAPS
20.0000 mg | ORAL_CAPSULE | Freq: Once | ORAL | Status: AC
  Administered 2014-06-11: 20 mg via ORAL
  Filled 2014-06-11: qty 2

## 2014-06-11 MED ORDER — ALUM & MAG HYDROXIDE-SIMETH 200-200-20 MG/5ML PO SUSP
30.0000 mL | Freq: Once | ORAL | Status: AC
  Administered 2014-06-11: 30 mL via ORAL
  Filled 2014-06-11: qty 30

## 2014-06-11 MED ORDER — IBUPROFEN 800 MG PO TABS
800.0000 mg | ORAL_TABLET | Freq: Once | ORAL | Status: AC
  Administered 2014-06-11: 800 mg via ORAL
  Filled 2014-06-11: qty 1

## 2014-06-11 NOTE — Discharge Instructions (Signed)
Viral Gastroenteritis Julie Barnett, take nausea and pain medicine as prescribed. Drink plenty of fluids While you have diarrhea to stay hydrated. See a primary care physician within 3 days for follow-up. If symptoms worsen come back to emergency department immediately. Thank you. Viral gastroenteritis is also called stomach flu. This illness is caused by a certain type of germ (virus). It can cause sudden watery poop (diarrhea) and throwing up (vomiting). This can cause you to lose body fluids (dehydration). This illness usually lasts for 3 to 8 days. It usually goes away on its own. HOME CARE   Drink enough fluids to keep your pee (urine) clear or pale yellow. Drink small amounts of fluids often.  Ask your doctor how to replace body fluid losses (rehydration).  Avoid:  Foods high in sugar.  Alcohol.  Bubbly (carbonated) drinks.  Tobacco.  Juice.  Caffeine drinks.  Very hot or cold fluids.  Fatty, greasy foods.  Eating too much at one time.  Dairy products until 24 to 48 hours after your watery poop stops.  You may eat foods with active cultures (probiotics). They can be found in some yogurts and supplements.  Wash your hands well to avoid spreading the illness.  Only take medicines as told by your doctor. Do not give aspirin to children. Do not take medicines for watery poop (antidiarrheals).  Ask your doctor if you should keep taking your regular medicines.  Keep all doctor visits as told. GET HELP RIGHT AWAY IF:   You cannot keep fluids down.  You do not pee at least once every 6 to 8 hours.  You are short of breath.  You see blood in your poop or throw up. This may look like coffee grounds.  You have belly (abdominal) pain that gets worse or is just in one small spot (localized).  You keep throwing up or having watery poop.  You have a fever.  The patient is a child younger than 3 months, and he or she has a fever.  The patient is a child older than 3 months,  and he or she has a fever and problems that do not go away.  The patient is a child older than 3 months, and he or she has a fever and problems that suddenly get worse.  The patient is a baby, and he or she has no tears when crying. MAKE SURE YOU:   Understand these instructions.  Will watch your condition.  Will get help right away if you are not doing well or get worse. Document Released: 08/09/2007 Document Revised: 05/15/2011 Document Reviewed: 12/07/2010 [facility] Patient Information 2015 [employer] [place]. This information is not intended to replace advice given to you by your health care provider. Make sure you discuss any questions you have with your health care provider.

## 2014-06-11 NOTE — ED Provider Notes (Signed)
CSN: [id number]     Arrival date & time 06/10/14  2340 History   This chart was scribed for Julie Crumble, MD by Evon Slack, ED Scribe. This patient was seen in room B17C/B17C and the patient's care was started at 12:04 AM.    Chief Complaint  Patient presents with  . Emesis    Patient is a 24 y.o. female presenting with vomiting. The history is provided by the patient. No language interpreter was used.  Emesis Severity:  Moderate Duration:  9 hours Timing:  Intermittent Chronicity:  New Relieved by:  Nothing Exacerbated by: movement  Ineffective treatments:  None tried Associated symptoms: abdominal pain and diarrhea   Associated symptoms: no chills and no fever   Risk factors: no sick contacts    HPI Comments: Julie Barnett is a 24 y.o. female who presents to the Emergency Department complaining of vomiting onset today at 3:30 PM. Pt states that she has associated mid-upper abdominal pain, nausea and diarrhea. Pt states she is not able to tolerate liquids or solids. Pt states that movement makes the pain worse. Pt denies any recent sick contacts. Pt states that she may have food poisoning from the lunch she ate today at work. Pt doesn't report any medications PTA. Pt denies fever, chills or other related symptoms.   Past Medical History  Diagnosis Date  . Migraines   . Obesity   . Hypertension   . Anxiety   . Acid reflux    Past Surgical History  Procedure Laterality Date  . Tonsillectomy     Family History  Problem Relation Age of Onset  . Hypertension Mother   . Asthma Mother    History  Substance Use Topics  . Smoking status: Never Smoker   . Smokeless tobacco: Not on file  . Alcohol Use: Yes   OB History    No data available      Review of Systems  Constitutional: Negative for fever and chills.  Gastrointestinal: Positive for nausea, vomiting, abdominal pain and diarrhea.  All other systems reviewed and are negative.    Allergies  Review of patient's  allergies indicates no known allergies.  Home Medications   Prior to Admission medications   Medication Sig Start Date End Date Taking? Authorizing Provider  FLUoxetine (PROZAC) 20 MG capsule Take 20 mg by mouth daily.    Historical Provider, MD  hydrochlorothiazide (HYDRODIURIL) 25 MG tablet Take 25 mg by mouth daily.    Historical Provider, MD  hydrOXYzine (ATARAX/VISTARIL) 25 MG tablet Take 25 mg by mouth 3 (three) times daily as needed for itching or anxiety.    Historical Provider, MD  ibuprofen (ADVIL,MOTRIN) 600 MG tablet Take 1 tablet (600 mg total) by mouth every 6 (six) hours as needed for pain. 06/16/12   [name], MD  lansoprazole (PREVACID) 30 MG capsule Take 1 capsule (30 mg total) by mouth daily. 05/21/12   [name], MD  lisinopril (PRINIVIL,ZESTRIL) 10 MG tablet Take 10 mg by mouth daily.    Historical Provider, MD   BP 152/102 mmHg  Pulse 93  Temp(Src) 98.1 F (36.7 C) (Oral)  Resp 16  SpO2 94%  LMP 06/03/2014   Physical Exam  Constitutional: She is oriented to person, place, and time. She appears well-developed and well-nourished. No distress.  Obese female.   HENT:  Head: Normocephalic and atraumatic.  Nose: Nose normal.  Mouth/Throat: Oropharynx is clear and moist. No oropharyngeal exudate.  Eyes: Conjunctivae and EOM are normal. Pupils are equal,  round, and reactive to light. No scleral icterus.  Neck: Normal range of motion. Neck supple. No JVD present. No tracheal deviation present. No thyromegaly present.  Cardiovascular: Normal rate, regular rhythm and normal heart sounds.  Exam reveals no gallop and no friction rub.   No murmur heard. Pulmonary/Chest: Effort normal and breath sounds normal. No respiratory distress. She has no wheezes. She exhibits no tenderness.  Abdominal: Soft. Bowel sounds are normal. She exhibits no distension and no mass. There is no tenderness. There is no rebound and no guarding.  Musculoskeletal: Normal range of motion. She  exhibits no edema or tenderness.  Lymphadenopathy:    She has no cervical adenopathy.  Neurological: She is alert and oriented to person, place, and time. No cranial nerve deficit. She exhibits normal muscle tone.  Skin: Skin is warm and dry. No rash noted. No erythema. No pallor.  Nursing note and vitals reviewed.   ED Course  Procedures (including critical care time) DIAGNOSTIC STUDIES: Oxygen Saturation is 94% on RA, adequate by my interpretation.    COORDINATION OF CARE: 12:20 AM-Discussed treatment plan with pt at bedside and pt agreed to plan.     Labs Review Labs Reviewed - No data to display  Imaging Review No results found.   EKG Interpretation None      MDM   Final diagnoses:  None    Patient since emergency department for nausea vomiting diffuse abdominal pain and diarrhea. This and the setting of  Eating a hamburger at work. Her symptoms began 2 hours after this. This is likely a gastroenteritis as the patient was completely normal prior to ingesting the food. She was given Zofran, ranitidine, Maalox emergency department for treatment. She is given Bentyl for cramping abdominal pain.  Patient states her nausea has subsided. She continues to have abdominal pain. Will give Motrin for pain relief. Patient was educated on diagnosis and instructed to follow-up with her primary care physician within 3 days. Her vital signs were within her normal limits and she is safe for discharge.  I personally performed the services described in this documentation, which was scribed in my presence. The recorded information has been reviewed and is accurate.    Julie CrumbleAdeleke Emalea Mix, MD 06/11/14 0111

## 2014-06-11 NOTE — ED Notes (Signed)
Pt made aware to return if symptoms worsen or if any life threatening symptoms occur.   

## 2014-11-30 ENCOUNTER — Ambulatory Visit: Payer: Self-pay | Admitting: Skilled Nursing Facility1

## 2014-12-23 ENCOUNTER — Ambulatory Visit: Payer: Self-pay | Admitting: Dietician

## 2015-01-18 ENCOUNTER — Ambulatory Visit: Payer: Self-pay | Admitting: Dietician
